# Patient Record
Sex: Male | Born: 1940 | Race: White | Hispanic: No | Marital: Single | Smoking: Former smoker
Health system: Southern US, Community
[De-identification: ages and names within clinical notes are randomized; demographics above are authoritative.]

## PROBLEM LIST (undated history)

## (undated) DIAGNOSIS — F1010 Alcohol abuse, uncomplicated: Secondary | ICD-10-CM

## (undated) DIAGNOSIS — C4491 Basal cell carcinoma of skin, unspecified: Secondary | ICD-10-CM

## (undated) DIAGNOSIS — N289 Disorder of kidney and ureter, unspecified: Secondary | ICD-10-CM

## (undated) DIAGNOSIS — L570 Actinic keratosis: Secondary | ICD-10-CM

## (undated) HISTORY — PX: OTHER SURGICAL HISTORY: SHX169

## (undated) HISTORY — DX: Actinic keratosis: L57.0

## (undated) HISTORY — DX: Basal cell carcinoma of skin, unspecified: C44.91

## (undated) HISTORY — PX: KNEE SURGERY: SHX244

---

## 2018-11-25 ENCOUNTER — Other Ambulatory Visit: Payer: Self-pay

## 2018-11-25 ENCOUNTER — Encounter: Payer: Self-pay | Admitting: Emergency Medicine

## 2018-11-25 ENCOUNTER — Emergency Department: Payer: Medicare Other

## 2018-11-25 ENCOUNTER — Emergency Department
Admission: EM | Admit: 2018-11-25 | Discharge: 2018-11-25 | Disposition: A | Discharge status: Home / Self Care | Payer: Medicare Other | Attending: Emergency Medicine | Admitting: Emergency Medicine

## 2018-11-25 DIAGNOSIS — Z79899 Other long term (current) drug therapy: Secondary | ICD-10-CM | POA: Diagnosis not present

## 2018-11-25 DIAGNOSIS — R42 Dizziness and giddiness: Secondary | ICD-10-CM | POA: Diagnosis present

## 2018-11-25 DIAGNOSIS — R112 Nausea with vomiting, unspecified: Secondary | ICD-10-CM | POA: Diagnosis not present

## 2018-11-25 DIAGNOSIS — I493 Ventricular premature depolarization: Secondary | ICD-10-CM | POA: Diagnosis not present

## 2018-11-25 DIAGNOSIS — E876 Hypokalemia: Secondary | ICD-10-CM | POA: Insufficient documentation

## 2018-11-25 HISTORY — DX: Alcohol abuse, uncomplicated: F10.10

## 2018-11-25 HISTORY — DX: Disorder of kidney and ureter, unspecified: N28.9

## 2018-11-25 LAB — CBC
HCT: 43.7 % (ref 39.0–52.0)
Hemoglobin: 15 g/dL (ref 13.0–17.0)
MCH: 30.7 pg (ref 26.0–34.0)
MCHC: 34.3 g/dL (ref 30.0–36.0)
MCV: 89.4 fL (ref 80.0–100.0)
Platelets: 197 10*3/uL (ref 150–400)
RBC: 4.89 MIL/uL (ref 4.22–5.81)
RDW: 11.5 % (ref 11.5–15.5)
WBC: 10.7 10*3/uL — ABNORMAL HIGH (ref 4.0–10.5)
nRBC: 0 % (ref 0.0–0.2)

## 2018-11-25 LAB — BASIC METABOLIC PANEL
Anion gap: 11 (ref 5–15)
BUN: 18 mg/dL (ref 8–23)
CO2: 22 mmol/L (ref 22–32)
Calcium: 8.8 mg/dL — ABNORMAL LOW (ref 8.9–10.3)
Chloride: 108 mmol/L (ref 98–111)
Creatinine, Ser: 1 mg/dL (ref 0.61–1.24)
GFR calc Af Amer: >60 mL/min (ref >60)
GFR calc non Af Amer: >60 mL/min (ref >60)
Glucose, Bld: 136 mg/dL — ABNORMAL HIGH (ref 70–99)
Potassium: 3.3 mmol/L — ABNORMAL LOW (ref 3.5–5.1)
Sodium: 141 mmol/L (ref 135–145)

## 2018-11-25 LAB — URINALYSIS, COMPLETE (UACMP) WITH MICROSCOPIC
Bacteria, UA: NONE SEEN
Bilirubin Urine: NEGATIVE
Glucose, UA: NEGATIVE mg/dL
Hgb urine dipstick: NEGATIVE
Ketones, ur: 5 mg/dL — AB
Leukocytes,Ua: NEGATIVE
Nitrite: NEGATIVE
Protein, ur: NEGATIVE mg/dL
Specific Gravity, Urine: 1.015 (ref 1.005–1.030)
pH: 6 (ref 5.0–8.0)

## 2018-11-25 LAB — TROPONIN I (HIGH SENSITIVITY)
Troponin I (High Sensitivity): 3 ng/L (ref <18)
Troponin I (High Sensitivity): 3 ng/L (ref <18)

## 2018-11-25 MED ORDER — SODIUM CHLORIDE 0.9% FLUSH: 3.0000 mL | Freq: Once | INTRAVENOUS | Status: DC

## 2018-11-25 MED ORDER — IOHEXOL 350 MG/ML SOLN
75.0000 mL | Freq: Once | INTRAVENOUS | Status: AC | PRN
  Administered 2018-11-25: 75 mL via INTRAVENOUS

## 2018-11-25 MED ORDER — POTASSIUM CHLORIDE CRYS ER 20 MEQ PO TBCR
40.0000 meq | EXTENDED_RELEASE_TABLET | Freq: Once | ORAL | Status: AC
  Administered 2018-11-25: 40 meq via ORAL
  Filled 2018-11-25: qty 2

## 2018-11-25 NOTE — ED Provider Notes (Signed)
[facility] Emergency Department Provider Note  ____________________________________________   First MD Initiated Contact with Patient 11/25/18 2031     (approximate)  I have reviewed the triage vital signs and the nursing notes.   HISTORY  Chief Complaint No chief complaint on file.    HPI Michael Johnson is a 78 y.o. male  Here with reported dizziness. Pt states that he was in his usual state of health until this afternoon. He reports that he was exercising, which is not uncommon for him, throughout the day. He had rested to eat and was going to go back to the room to exercise. He stood up, starting to walk and then experienced acute onset of what he describes as severe, room spinning sensation with associated nausea and vomiting. He felt like he was moving despite being still. He felt so dizzy that he could not stand and crawled to a phone to call for help. He states he did not have any associated vision changes, numbness, weakness, dysarthria, or dysphagia. He has no history of similar episodes. He called his daughter, who reports pt was feeling unwell but spoke well and normally, with no slurring. He now feels back to baseline.        Past Medical History:  Diagnosis Date   Alcohol abuse    Renal disorder    Stage 3 Kidney disease    There are no active problems to display for this patient.   Past Surgical History:  Procedure Laterality Date   Arm Surgery     KNEE SURGERY     Bilateral    Prior to Admission medications   Not on File    Allergies Patient has no known allergies.  History reviewed. No pertinent family history.  Social History Social History   Tobacco Use   Smoking status: Former Smoker    Types: Pipe   Smokeless tobacco: Never Used   Tobacco comment: Last use 36 yrs ago  Substance Use Topics   Alcohol use: Not Currently    Comment: Last use 36 yrs ago   Drug use: Not Currently    Review of Systems  Review  of Systems  Constitutional: Negative for chills, fatigue and fever.  HENT: Negative for sore throat.   Respiratory: Negative for shortness of breath.   Cardiovascular: Negative for chest pain.  Gastrointestinal: Positive for nausea and vomiting. Negative for abdominal pain.  Genitourinary: Negative for flank pain.  Musculoskeletal: Negative for neck pain.  Skin: Negative for rash and wound.  Allergic/Immunologic: Negative for immunocompromised state.  Neurological: Positive for dizziness. Negative for weakness and numbness.  Hematological: Does not bruise/bleed easily.  All other systems reviewed and are negative.    ____________________________________________  PHYSICAL EXAM:      VITAL SIGNS: ED Triage Vitals  Enc Vitals Group     BP 11/25/18 1444 (!) 147/71     Pulse Rate 11/25/18 1444 70     Resp 11/25/18 1444 (!) 22     Temp 11/25/18 1444 97.6 F (36.4 C)     Temp Source 11/25/18 1444 Oral     SpO2 11/25/18 1444 100 %     Weight 11/25/18 1442 180 lb (81.6 kg)     Height 11/25/18 1442 5\' 9"  (1.753 m)     Head Circumference --      Peak Flow --      Pain Score 11/25/18 1442 0     Pain Loc --      Pain Edu? --  Excl. in [place]? --      Physical Exam Vitals signs and nursing note reviewed.  Constitutional:      General: He is not in acute distress.    Appearance: He is well-developed.  HENT:     Head: Normocephalic and atraumatic.  Eyes:     Conjunctiva/sclera: Conjunctivae normal.  Neck:     Musculoskeletal: Neck supple.  Cardiovascular:     Rate and Rhythm: Normal rate and regular rhythm.     Heart sounds: Normal heart sounds.  Pulmonary:     Effort: Pulmonary effort is normal. No respiratory distress.     Breath sounds: No wheezing.  Abdominal:     General: There is no distension.  Skin:    General: Skin is warm.     Capillary Refill: Capillary refill takes less than 2 seconds.     Findings: No rash.  Neurological:     Mental Status: He is alert and  oriented to person, place, and time.     Motor: No abnormal muscle tone.     Comments: Neurological Exam:  Mental Status: Alert and oriented to person, place, and time. Attention and concentration normal. Speech clear. Recent memory is intact. Cranial Nerves: Visual fields grossly intact. EOMI and PERRLA. No nystagmus noted. Facial sensation intact at forehead, maxillary cheek, and chin/mandible bilaterally. No facial asymmetry or weakness. Hearing grossly normal. Uvula is midline, and palate elevates symmetrically. Normal SCM and trapezius strength. Tongue midline without fasciculations. Motor: Muscle strength 5/5 in proximal and distal UE and LE bilaterally. No pronator drift. Muscle tone normal. Sensation: Intact to light touch in upper and lower extremities distally bilaterally.  Gait: Normal without ataxia. Coordination: Normal FTN bilaterally.          ____________________________________________   LABS (all labs ordered are listed, but only abnormal results are displayed)  Labs Reviewed  BASIC METABOLIC PANEL - Abnormal; Notable for the following components:      Result Value   Potassium 3.3 (*)    Glucose, Bld 136 (*)    Calcium 8.8 (*)    All other components within normal limits  CBC - Abnormal; Notable for the following components:   WBC 10.7 (*)    All other components within normal limits  URINALYSIS, COMPLETE (UACMP) WITH MICROSCOPIC - Abnormal; Notable for the following components:   Color, Urine YELLOW (*)    APPearance CLEAR (*)    Ketones, ur 5 (*)    All other components within normal limits  TROPONIN I (HIGH SENSITIVITY)  TROPONIN I (HIGH SENSITIVITY)    ____________________________________________  EKG: Normal sinus rhythm, VR 67. QTc 458. No acute ST-t segment changes. No EKG evidence of acute ischemia or infarct. ________________________________________  RADIOLOGY All imaging, including plain films, CT scans, and ultrasounds, independently reviewed  by me, and interpretations confirmed via formal radiology reads.  ED MD interpretation:   CT Angio Head/Neck: Normal, no high grade stenosis, no CVA  Official radiology report(s): Ct Angio Head W Or Wo Contrast  Result Date: 11/25/2018 CLINICAL DATA:  Weakness and syncope. EXAM: CT ANGIOGRAPHY HEAD AND NECK TECHNIQUE: Multidetector CT imaging of the head and neck was performed using the standard protocol during bolus administration of intravenous contrast. Multiplanar CT image reconstructions and MIPs were obtained to evaluate the vascular anatomy. Carotid stenosis measurements (when applicable) are obtained utilizing NASCET criteria, using the distal internal carotid diameter as the denominator. CONTRAST:  19mL OMNIPAQUE IOHEXOL 350 MG/ML SOLN COMPARISON:  None. FINDINGS: CT HEAD FINDINGS Brain:  There is no mass, hemorrhage or extra-axial collection. Mild generalized volume loss. There is no acute or chronic infarction. The brain parenchyma is normal. Skull: The visualized skull base, calvarium and extracranial soft tissues are normal. Sinuses/Orbits: No fluid levels or advanced mucosal thickening of the visualized paranasal sinuses. No mastoid or middle ear effusion. The orbits are normal. CTA NECK FINDINGS SKELETON: There is no bony spinal canal stenosis. No lytic or blastic lesion. OTHER NECK: Normal pharynx, larynx and major salivary glands. No cervical lymphadenopathy. Unremarkable thyroid gland. UPPER CHEST: No pneumothorax or pleural effusion. No nodules or masses. AORTIC ARCH: There is no calcific atherosclerosis of the aortic arch. There is no aneurysm, dissection or hemodynamically significant stenosis of the visualized portion of the aorta. Conventional 3 vessel aortic branching pattern. The visualized proximal subclavian arteries are widely patent. RIGHT CAROTID SYSTEM: Normal without aneurysm, dissection or stenosis. LEFT CAROTID SYSTEM: Normal without aneurysm, dissection or stenosis.  VERTEBRAL ARTERIES: Left dominant configuration. Both origins are clearly patent. There is no dissection, occlusion or flow-limiting stenosis to the skull base (V1-V3 segments). CTA HEAD FINDINGS POSTERIOR CIRCULATION: --Vertebral arteries: Normal V4 segments. --Posterior inferior cerebellar arteries (PICA): Patent origins from the vertebral arteries. --Anterior inferior cerebellar arteries (AICA): Patent origins from the basilar artery. --Basilar artery: Normal. --Superior cerebellar arteries: Normal. --Posterior cerebral arteries: Normal. Both originate from the basilar artery. Posterior communicating arteries (p-comm) are diminutive or absent. ANTERIOR CIRCULATION: --Intracranial internal carotid arteries: Normal. --Anterior cerebral arteries (ACA): Normal. Both A1 segments are present. Patent anterior communicating artery (a-comm). --Middle cerebral arteries (MCA): Normal. VENOUS SINUSES: As permitted by contrast timing, patent. ANATOMIC VARIANTS: None Review of the MIP images confirms the above findings. IMPRESSION: 1. Normal aging brain. 2. Normal CTA of the head and neck. Electronically Signed   By: Ulyses Jarred M.D.   On: 11/25/2018 22:25   Ct Angio Neck W And/or Wo Contrast  Result Date: 11/25/2018 CLINICAL DATA:  Weakness and syncope. EXAM: CT ANGIOGRAPHY HEAD AND NECK TECHNIQUE: Multidetector CT imaging of the head and neck was performed using the standard protocol during bolus administration of intravenous contrast. Multiplanar CT image reconstructions and MIPs were obtained to evaluate the vascular anatomy. Carotid stenosis measurements (when applicable) are obtained utilizing NASCET criteria, using the distal internal carotid diameter as the denominator. CONTRAST:  74mL OMNIPAQUE IOHEXOL 350 MG/ML SOLN COMPARISON:  None. FINDINGS: CT HEAD FINDINGS Brain: There is no mass, hemorrhage or extra-axial collection. Mild generalized volume loss. There is no acute or chronic infarction. The brain  parenchyma is normal. Skull: The visualized skull base, calvarium and extracranial soft tissues are normal. Sinuses/Orbits: No fluid levels or advanced mucosal thickening of the visualized paranasal sinuses. No mastoid or middle ear effusion. The orbits are normal. CTA NECK FINDINGS SKELETON: There is no bony spinal canal stenosis. No lytic or blastic lesion. OTHER NECK: Normal pharynx, larynx and major salivary glands. No cervical lymphadenopathy. Unremarkable thyroid gland. UPPER CHEST: No pneumothorax or pleural effusion. No nodules or masses. AORTIC ARCH: There is no calcific atherosclerosis of the aortic arch. There is no aneurysm, dissection or hemodynamically significant stenosis of the visualized portion of the aorta. Conventional 3 vessel aortic branching pattern. The visualized proximal subclavian arteries are widely patent. RIGHT CAROTID SYSTEM: Normal without aneurysm, dissection or stenosis. LEFT CAROTID SYSTEM: Normal without aneurysm, dissection or stenosis. VERTEBRAL ARTERIES: Left dominant configuration. Both origins are clearly patent. There is no dissection, occlusion or flow-limiting stenosis to the skull base (V1-V3 segments). CTA HEAD FINDINGS POSTERIOR CIRCULATION: --  Vertebral arteries: Normal V4 segments. --Posterior inferior cerebellar arteries (PICA): Patent origins from the vertebral arteries. --Anterior inferior cerebellar arteries (AICA): Patent origins from the basilar artery. --Basilar artery: Normal. --Superior cerebellar arteries: Normal. --Posterior cerebral arteries: Normal. Both originate from the basilar artery. Posterior communicating arteries (p-comm) are diminutive or absent. ANTERIOR CIRCULATION: --Intracranial internal carotid arteries: Normal. --Anterior cerebral arteries (ACA): Normal. Both A1 segments are present. Patent anterior communicating artery (a-comm). --Middle cerebral arteries (MCA): Normal. VENOUS SINUSES: As permitted by contrast timing, patent. ANATOMIC  VARIANTS: None Review of the MIP images confirms the above findings. IMPRESSION: 1. Normal aging brain. 2. Normal CTA of the head and neck. Electronically Signed   By: Ulyses Jarred M.D.   On: 11/25/2018 22:25    ____________________________________________  PROCEDURES   Procedure(s) performed (including Critical Care):  Procedures  ____________________________________________  INITIAL IMPRESSION / MDM / [place] / ED COURSE  As part of my medical decision making, I reviewed the following data within the [place] notes reviewed and incorporated, Old chart reviewed, Notes from prior ED visits, and [place] Controlled Substance Database       *Michael Johnson was evaluated in Emergency Department on 11/26/2018 for the symptoms described in the history of present illness. He was evaluated in the context of the global COVID-19 pandemic, which necessitated consideration that the patient might be at risk for infection with the SARS-CoV-2 virus that causes COVID-19. Institutional protocols and algorithms that pertain to the evaluation of patients at risk for COVID-19 are in a state of rapid change based on information released by regulatory bodies including the CDC and federal and state organizations. These policies and algorithms were followed during the patient's care in the ED.  Some ED evaluations and interventions may be delayed as a result of limited staffing during the pandemic.*     Medical Decision Making:  78 yo M here with transient episode of likely vertigo. History fits with a peripheral etiology, though given his age must certainly consider central cause. On full neuro exam, he has no evidence to suggest ongoing cerebellar/posterior ischemia or mass. Symptoms are resolved. EKG is nonischemic with no arrhythmia. His labs are very reassuring overall. Mild hypokalemia is likely due to his intermittent fasting and poor PO intake today, and this was replaced.    Had a long discussion with pt, daughter regarding concern for possible posterior TIA, though differential also includes peripheral vertigo/BPPV, also possible transient hypoperfusion 2/2 arrhythmia (reported frequent PVCs per EMS, though minimal PVCs here on telemetry). Pt understands that despite a negative scan and labs, there remains a small but real chance of posterior stroke as well as risk of TIA with subsequent additional future CVA risk.  Based on shared decision making, and fact that sx are now resolved with reassuring labs and CT Angio, pt would like to continue work up as outpt. He declines admission at this time. Will refer him to neurology and his PCP. Will have him start a low-dose ASA while awaiting TIA eval and work-up. Return precautions given. ____________________________________________  FINAL CLINICAL IMPRESSION(S) / ED DIAGNOSES  Final diagnoses:  Dizziness  Vertigo  PVC's (premature ventricular contractions)  Hypokalemia     MEDICATIONS GIVEN DURING THIS VISIT:  Medications  iohexol (OMNIPAQUE) 350 MG/ML injection 75 mL (75 mLs Intravenous Contrast Given 11/25/18 2125)  potassium chloride SA (KLOR-CON) CR tablet 40 mEq (40 mEq Oral Given 11/25/18 2248)     ED Discharge Orders    None  Note:  This document was prepared using Dragon voice recognition software and may include unintentional dictation errors.   Duffy Bruce, MD 11/26/18 [phone]

## 2018-11-25 NOTE — ED Notes (Signed)
Patient updated that we are waiting on results from CT

## 2018-11-25 NOTE — Discharge Instructions (Addendum)
As we discussed, I would recommend taking an 81 mg aspirin daily.  You can take an over-the-counter version.  Take with food.  As we discussed as well, although we cannot definitively rule out a stroke as the cause of your vertigo, or a TIA, your imaging today was reassuring.  Your lab work was also very reassuring.  You had a mildly low potassium, which is likely diet related and I recommend that you increase fruit intake.  Please call your primary doctor on Monday as well as neurology at [facility], to arrange follow-up.

## 2018-11-25 NOTE — ED Notes (Signed)
Daughter updated on wait at this time.

## 2018-11-25 NOTE — ED Triage Notes (Signed)
Pt presents to ED via [facility] from home with c/o weakness and near syncope. Per EMS pt has 5 hr intermittent workout plan that he does at home, EMS reports sudden onset dizziness, nausea, and weakness. Pt reports does 18/6 intermittent fasting, states the fasting ends at 1400, has been doing that for 11 years. EMS reports pt crawled down the stairs to get to EMS. EMS reports trigeminy and bigeminy on their 12lead. EMS reports approx 300cc's NS and 4mg  Zofran given PTA.   Pt arrives alert and oriented on arrival. Pt noted to be pale on arrival to ED.   18g to R hand CBG 153 HR 72 100% on RA 144/70

## 2018-11-25 NOTE — ED Notes (Signed)
Pt's daughter to desk at this time regarding wait, pt's daughter states "He's been here since 68, what is he waiting for?" Pt's daughter noted to be agitated, and upset at this time. Pt's daughter did not acknowledge this RN, however spoke angrily and loudly to [place], Registration.

## 2018-11-25 NOTE — ED Notes (Addendum)
Patient is extremely frustrated and angry about the wait, daughter is with patient and is even more agitated. Patient angry about not being prioritized over other patients, says he doesn't understand why "prisoners" and people who are less serious are being treated before him; this RN tried multiple times to explain triage process to patient and the extended wait times people are experiencing due to the hospital being full. This RN Soil scientist and EDP

## 2018-11-25 NOTE — ED Notes (Signed)
ED Provider at bedside. 

## 2019-01-27 ENCOUNTER — Ambulatory Visit: Payer: Medicare Other | Attending: Internal Medicine

## 2019-01-27 DIAGNOSIS — Z23 Encounter for immunization: Secondary | ICD-10-CM | POA: Insufficient documentation

## 2019-01-27 NOTE — Progress Notes (Signed)
   Covid-19 Vaccination Clinic  Name:  Michael Johnson    MRN: [id number] DOB: Mar 29, 1940  01/27/2019  Michael Johnson was observed post Covid-19 immunization for 15 minutes without incidence. He was provided with Vaccine Information Sheet and instruction to access the V-Safe system.   Michael Johnson was instructed to call 911 with any severe reactions post vaccine: Marland Kitchen Difficulty breathing  . Swelling of your face and throat  . A fast heartbeat  . A bad rash all over your body  . Dizziness and weakness    Immunizations Administered    Name Date Dose VIS Date Route   Pfizer COVID-19 Vaccine 01/27/2019  9:39 AM 0.3 mL 12/23/2018 Intramuscular   Manufacturer: [employer]   Lot: [id number]   [place]: [id number]

## 2019-02-16 ENCOUNTER — Ambulatory Visit: Payer: Medicare Other | Attending: Internal Medicine

## 2019-02-16 DIAGNOSIS — Z23 Encounter for immunization: Secondary | ICD-10-CM

## 2019-02-16 NOTE — Progress Notes (Signed)
   Covid-19 Vaccination Clinic  Name:  Michael Johnson    MRN: [id number] DOB: 07/30/1940  02/16/2019  Michael Johnson was observed post Covid-19 immunization for 15 minutes without incidence. He was provided with Vaccine Information Sheet and instruction to access the V-Safe system.   Michael Johnson was instructed to call 911 with any severe reactions post vaccine: Marland Kitchen Difficulty breathing  . Swelling of your face and throat  . A fast heartbeat  . A bad rash all over your body  . Dizziness and weakness    Immunizations Administered    Name Date Dose VIS Date Route   Pfizer COVID-19 Vaccine 02/16/2019 12:44 PM 0.3 mL 12/23/2018 Intramuscular   Manufacturer: [place]   Lot: [id number]   [place]: [id number]

## 2019-06-22 ENCOUNTER — Other Ambulatory Visit: Payer: Self-pay

## 2019-06-22 ENCOUNTER — Ambulatory Visit (INDEPENDENT_AMBULATORY_CARE_PROVIDER_SITE_OTHER): Payer: Medicare Other | Admitting: Dermatology

## 2019-06-22 ENCOUNTER — Encounter: Payer: Self-pay | Admitting: Dermatology

## 2019-06-22 DIAGNOSIS — Z86018 Personal history of other benign neoplasm: Secondary | ICD-10-CM

## 2019-06-22 DIAGNOSIS — Z1283 Encounter for screening for malignant neoplasm of skin: Secondary | ICD-10-CM | POA: Diagnosis not present

## 2019-06-22 DIAGNOSIS — D229 Melanocytic nevi, unspecified: Secondary | ICD-10-CM | POA: Diagnosis not present

## 2019-06-22 DIAGNOSIS — Z85828 Personal history of other malignant neoplasm of skin: Secondary | ICD-10-CM | POA: Diagnosis not present

## 2019-06-22 DIAGNOSIS — D1801 Hemangioma of skin and subcutaneous tissue: Secondary | ICD-10-CM

## 2019-06-22 DIAGNOSIS — D692 Other nonthrombocytopenic purpura: Secondary | ICD-10-CM

## 2019-06-22 DIAGNOSIS — L814 Other melanin hyperpigmentation: Secondary | ICD-10-CM

## 2019-06-22 DIAGNOSIS — L578 Other skin changes due to chronic exposure to nonionizing radiation: Secondary | ICD-10-CM

## 2019-06-22 DIAGNOSIS — L821 Other seborrheic keratosis: Secondary | ICD-10-CM

## 2019-06-22 NOTE — Patient Instructions (Signed)
Recommend daily broad spectrum sunscreen SPF 30+ to sun-exposed areas, reapply every 2 hours as needed. Call for new or changing lesions.  

## 2019-06-22 NOTE — Progress Notes (Signed)
   Follow-Up Visit   Subjective  Michael Johnson is a 79 y.o. male who presents for the following: TBSE. The patient presents for Total-Body Skin Exam (TBSE) for skin cancer screening and mole check.  Patient presents today for a TBSE, he has no concerns today. Patient does have a history of [place] R. Chest and arms Patient prefers to be seen every 6 months for a TBSE  The following portions of the chart were reviewed this encounter and updated as appropriate:  Tobacco  Allergies  Meds  Problems  Med Hx  Surg Hx  Fam Hx      Review of Systems:  No other skin or systemic complaints except as noted in HPI or Assessment and Plan.  Objective  Well appearing patient in no apparent distress; mood and affect are within normal limits.  A full examination was performed including scalp, head, eyes, ears, nose, lips, neck, chest, axillae, abdomen, back, buttocks, bilateral upper extremities, bilateral lower extremities, hands, feet, fingers, toes, fingernails, and toenails. All findings within normal limits unless otherwise noted below.  Objective  Right Chest, arms: Well healed scar with no evidence of recurrence.   Objective  Right Superior Chest: Well healed scar    Assessment & Plan    History of basal cell carcinoma (BCC) Right Chest, arms  Clear. Observe for recurrence. Call clinic for new or changing lesions.  Recommend regular skin exams, daily broad-spectrum spf 30+ sunscreen use, and photoprotection.     Personal history of other benign neoplasm Right Superior Chest  BX proven: pigmented seborrheic overlying melanocytic nevus intradermal type. Clear. Observe for recurrence. Call clinic for new or changing lesions.  Recommend regular skin exams, daily broad-spectrum spf 30+ sunscreen use, and photoprotection.     Skin cancer screening  Lentigines - Scattered tan macules - Discussed due to sun exposure - Benign, observe - Call for any changes  Seborrheic Keratoses -  Stuck-on, waxy, tan-brown papules and plaques  - Discussed benign etiology and prognosis. - Observe - Call for any changes  Melanocytic Nevi - Tan-brown and/or pink-flesh-colored symmetric macules and papules - Benign appearing on exam today - Observation - Call clinic for new or changing moles - Recommend daily use of broad spectrum spf 30+ sunscreen to sun-exposed areas.   Hemangiomas - Red papules - Discussed benign nature - Observe - Call for any changes  Actinic Damage - diffuse scaly erythematous macules with underlying dyspigmentation - Recommend daily broad spectrum sunscreen SPF 30+ to sun-exposed areas, reapply every 2 hours as needed.  - Call for new or changing lesions.  Purpura - Violaceous macules and patches - Benign - Related to age, sun damage and/or use of blood thinners - Observe - Can use OTC arnica containing moisturizer such as Dermend Bruise Formula if desired - Call for worsening or other concerns   Skin cancer screening performed today.   Return in about 6 months (around 12/22/2019) for TBSE.  I, Donzetta Kohut, CMA, am acting as scribe for Sarina Ser, MD . Documentation: I have reviewed the above documentation for accuracy and completeness, and I agree with the above.  Sarina Ser, MD

## 2019-06-26 ENCOUNTER — Encounter: Payer: Self-pay | Admitting: Dermatology

## 2019-12-20 ENCOUNTER — Ambulatory Visit (INDEPENDENT_AMBULATORY_CARE_PROVIDER_SITE_OTHER): Payer: Medicare Other | Admitting: Dermatology

## 2019-12-20 ENCOUNTER — Other Ambulatory Visit: Payer: Self-pay

## 2019-12-20 DIAGNOSIS — L821 Other seborrheic keratosis: Secondary | ICD-10-CM

## 2019-12-20 DIAGNOSIS — D692 Other nonthrombocytopenic purpura: Secondary | ICD-10-CM

## 2019-12-20 DIAGNOSIS — D229 Melanocytic nevi, unspecified: Secondary | ICD-10-CM

## 2019-12-20 DIAGNOSIS — L814 Other melanin hyperpigmentation: Secondary | ICD-10-CM | POA: Diagnosis not present

## 2019-12-20 DIAGNOSIS — D1800 Hemangioma unspecified site: Secondary | ICD-10-CM

## 2019-12-20 DIAGNOSIS — Z1283 Encounter for screening for malignant neoplasm of skin: Secondary | ICD-10-CM | POA: Diagnosis not present

## 2019-12-20 DIAGNOSIS — L578 Other skin changes due to chronic exposure to nonionizing radiation: Secondary | ICD-10-CM

## 2019-12-20 DIAGNOSIS — Z85828 Personal history of other malignant neoplasm of skin: Secondary | ICD-10-CM

## 2019-12-20 DIAGNOSIS — L570 Actinic keratosis: Secondary | ICD-10-CM | POA: Diagnosis not present

## 2019-12-20 NOTE — Progress Notes (Signed)
   Follow-Up Visit   Subjective  Michael Johnson is a 79 y.o. male who presents for the following: mole check (Total body skin exam, hx of BCC). The patient presents for Total-Body Skin Exam (TBSE) for skin cancer screening and mole check.  The following portions of the chart were reviewed this encounter and updated as appropriate:   Tobacco  Allergies  Meds  Problems  Med Hx  Surg Hx  Fam Hx     Review of Systems:  No other skin or systemic complaints except as noted in HPI or Assessment and Plan.  Objective  Well appearing patient in no apparent distress; mood and affect are within normal limits.  A full examination was performed including scalp, head, eyes, ears, nose, lips, neck, chest, axillae, abdomen, back, buttocks, bilateral upper extremities, bilateral lower extremities, hands, feet, fingers, toes, fingernails, and toenails. All findings within normal limits unless otherwise noted below.  Objective  Scalp x 4, R ear x 2 (6): Pink scaly macules   Assessment & Plan    Lentigines - Scattered tan macules - Discussed due to sun exposure - Benign, observe - Call for any changes  Seborrheic Keratoses - Stuck-on, waxy, tan-brown papules and plaques  - Discussed benign etiology and prognosis. - Observe - Call for any changes  Melanocytic Nevi - Tan-brown and/or pink-flesh-colored symmetric macules and papules - Benign appearing on exam today - Observation - Call clinic for new or changing moles - Recommend daily use of broad spectrum spf 30+ sunscreen to sun-exposed areas.   Hemangiomas - Red papules - Discussed benign nature - Observe - Call for any changes  Actinic Damage - Chronic, secondary to cumulative UV/sun exposure - diffuse scaly erythematous macules with underlying dyspigmentation - Recommend daily broad spectrum sunscreen SPF 30+ to sun-exposed areas, reapply every 2 hours as needed.  - Call for new or changing lesions.  Skin cancer screening  performed today.  Purpura - Chronic; persistent and recurrent.  Treatable, but not curable. - Violaceous macules and patches - Benign - Related to age, sun damage and/or use of blood thinners - Observe - Can use OTC arnica containing moisturizer such as Dermend Bruise Formula if desired - Call for worsening or other concerns  History of Basal Cell Carcinoma of the Skin - No evidence of recurrence today - Recommend regular full body skin exams - Recommend daily broad spectrum sunscreen SPF 30+ to sun-exposed areas, reapply every 2 hours as needed.  - Call if any new or changing lesions are noted between office visits - arms/chest treated in past  AK (actinic keratosis) (6) Scalp x 4, R ear x 2  Destruction of lesion - Scalp x 4, R ear x 2 Complexity: simple   Destruction method: cryotherapy   Informed consent: discussed and consent obtained   Timeout:  patient name, date of birth, surgical site, and procedure verified Lesion destroyed using liquid nitrogen: Yes   Region frozen until ice ball extended beyond lesion: Yes   Outcome: patient tolerated procedure well with no complications   Post-procedure details: wound care instructions given    Skin cancer screening  Return in about 6 months (around 06/19/2020) for UBSE, Hx of BCC, Hx of AKs.   I, Othelia Pulling, RMA, am acting as scribe for Sarina Ser, MD .  Documentation: I have reviewed the above documentation for accuracy and completeness, and I agree with the above.  Sarina Ser, MD

## 2019-12-20 NOTE — Patient Instructions (Signed)
Cryotherapy Aftercare  . Wash gently with soap and water everyday.   . Apply Vaseline and Band-Aid daily until healed.  

## 2019-12-26 ENCOUNTER — Encounter: Payer: Self-pay | Admitting: Dermatology

## 2020-07-03 ENCOUNTER — Other Ambulatory Visit: Payer: Self-pay

## 2020-07-03 ENCOUNTER — Ambulatory Visit (INDEPENDENT_AMBULATORY_CARE_PROVIDER_SITE_OTHER): Payer: Medicare Other | Admitting: Dermatology

## 2020-07-03 DIAGNOSIS — D1800 Hemangioma unspecified site: Secondary | ICD-10-CM

## 2020-07-03 DIAGNOSIS — L570 Actinic keratosis: Secondary | ICD-10-CM | POA: Diagnosis not present

## 2020-07-03 DIAGNOSIS — Z1283 Encounter for screening for malignant neoplasm of skin: Secondary | ICD-10-CM | POA: Diagnosis not present

## 2020-07-03 DIAGNOSIS — Z85828 Personal history of other malignant neoplasm of skin: Secondary | ICD-10-CM | POA: Diagnosis not present

## 2020-07-03 DIAGNOSIS — L820 Inflamed seborrheic keratosis: Secondary | ICD-10-CM | POA: Diagnosis not present

## 2020-07-03 DIAGNOSIS — L821 Other seborrheic keratosis: Secondary | ICD-10-CM

## 2020-07-03 DIAGNOSIS — D692 Other nonthrombocytopenic purpura: Secondary | ICD-10-CM

## 2020-07-03 DIAGNOSIS — L578 Other skin changes due to chronic exposure to nonionizing radiation: Secondary | ICD-10-CM

## 2020-07-03 DIAGNOSIS — D229 Melanocytic nevi, unspecified: Secondary | ICD-10-CM

## 2020-07-03 DIAGNOSIS — L814 Other melanin hyperpigmentation: Secondary | ICD-10-CM

## 2020-07-03 NOTE — Progress Notes (Signed)
Follow-Up Visit   Subjective  Michael Johnson is a 80 y.o. male who presents for the following: Annual Exam (History of BCC - TBSE today). The patient presents for Total-Body Skin Exam (TBSE) for skin cancer screening and mole check.  The following portions of the chart were reviewed this encounter and updated as appropriate:   Tobacco  Allergies  Meds  Problems  Med Hx  Surg Hx  Fam Hx      Review of Systems:  No other skin or systemic complaints except as noted in HPI or Assessment and Plan.  Objective  Well appearing patient in no apparent distress; mood and affect are within normal limits.  A full examination was performed including scalp, head, eyes, ears, nose, lips, neck, chest, axillae, abdomen, back, buttocks, bilateral upper extremities, bilateral lower extremities, hands, feet, fingers, toes, fingernails, and toenails. All findings within normal limits unless otherwise noted below.  Trunk, arms (18) Erythematous keratotic or waxy stuck-on papule or plaque.   Right Temple Erythematous thin papules/macules with gritty scale.    Assessment & Plan   History of Basal Cell Carcinoma of the Skin - No evidence of recurrence today - Recommend regular full body skin exams - Recommend daily broad spectrum sunscreen SPF 30+ to sun-exposed areas, reapply every 2 hours as needed.  - Call if any new or changing lesions are noted between office visits  Purpura - Chronic; persistent and recurrent.  Treatable, but not curable. - Violaceous macules and patches - Benign - Related to trauma, age, sun damage and/or use of blood thinners, chronic use of topical and/or oral steroids - Observe - Can use OTC arnica containing moisturizer such as Dermend Bruise Formula if desired - Call for worsening or other concerns  Lentigines - Scattered tan macules - Due to sun exposure - Benign-appering, observe - Recommend daily broad spectrum sunscreen SPF 30+ to sun-exposed areas, reapply  every 2 hours as needed. - Call for any changes  Seborrheic Keratoses - Stuck-on, waxy, tan-brown papules and/or plaques  - Benign-appearing - Discussed benign etiology and prognosis. - Observe - Call for any changes  Melanocytic Nevi - Tan-brown and/or pink-flesh-colored symmetric macules and papules - Benign appearing on exam today - Observation - Call clinic for new or changing moles - Recommend daily use of broad spectrum spf 30+ sunscreen to sun-exposed areas.   Hemangiomas - Red papules - Discussed benign nature - Observe - Call for any changes  Actinic Damage - Chronic condition, secondary to cumulative UV/sun exposure - diffuse scaly erythematous macules with underlying dyspigmentation - Recommend daily broad spectrum sunscreen SPF 30+ to sun-exposed areas, reapply every 2 hours as needed.  - Staying in the shade or wearing long sleeves, sun glasses (UVA+UVB protection) and wide brim hats (4-inch brim around the entire circumference of the hat) are also recommended for sun protection.  - Call for new or changing lesions.  Skin cancer screening performed today.  Inflamed seborrheic keratosis Trunk, arms x18  Destruction of lesion - Trunk, arms Complexity: simple   Destruction method: cryotherapy   Informed consent: discussed and consent obtained   Timeout:  patient name, date of birth, surgical site, and procedure verified Lesion destroyed using liquid nitrogen: Yes   Region frozen until ice ball extended beyond lesion: Yes   Outcome: patient tolerated procedure well with no complications   Post-procedure details: wound care instructions given    AK (actinic keratosis) Right Temple x 1  Destruction of lesion - Right Temple Complexity: simple  Destruction method: cryotherapy   Informed consent: discussed and consent obtained   Timeout:  patient name, date of birth, surgical site, and procedure verified Lesion destroyed using liquid nitrogen: Yes   Region  frozen until ice ball extended beyond lesion: Yes   Outcome: patient tolerated procedure well with no complications   Post-procedure details: wound care instructions given    Skin cancer screening  Return in about 6 months (around 01/02/2021).  I, Ashok Cordia, CMA, am acting as scribe for Sarina Ser, MD .  Documentation: I have reviewed the above documentation for accuracy and completeness, and I agree with the above.  Sarina Ser, MD

## 2020-07-03 NOTE — Patient Instructions (Signed)

## 2020-07-09 ENCOUNTER — Encounter: Payer: Self-pay | Admitting: Dermatology

## 2020-07-24 ENCOUNTER — Other Ambulatory Visit: Payer: Self-pay

## 2020-07-24 ENCOUNTER — Ambulatory Visit (INDEPENDENT_AMBULATORY_CARE_PROVIDER_SITE_OTHER): Payer: Medicare Other | Admitting: Urology

## 2020-07-24 ENCOUNTER — Encounter: Payer: Self-pay | Admitting: Urology

## 2020-07-24 VITALS — BP 147/79 | HR 96 | Ht 69.0 in | Wt 192.0 lb

## 2020-07-24 DIAGNOSIS — N5082 Scrotal pain: Secondary | ICD-10-CM

## 2020-07-24 MED ORDER — SULFAMETHOXAZOLE-TRIMETHOPRIM 800-160 MG PO TABS: 1.0000 | ORAL_TABLET | Freq: Two times a day (BID) | ORAL | 0 refills | Status: AC

## 2020-07-24 NOTE — Progress Notes (Signed)
07/24/2020 3:13 PM   Michael Johnson 1940/04/15 [id number]  Referring provider: Derinda Late, MD [phone] [address] [place],  [place] [address]  Chief Complaint  Patient presents with   Other    HPI: Michael Johnson is a 80 y.o. male who presents for follow-up of recent PCP visit for epididymitis.  Saw Dr. Baldemar Lenis 07/16/2020 with left hemiscrotal pain Severity rated 7/10 History of chronic left hemiscrotal pain since 2014.  Saw a urologist in [place] and states the etiology of his pain was potentially felt to be neuropathic Prior history of scrotal trauma age 43 He has had a previous vasectomy On recent visit he was noted to have tenderness of the left epididymis and was treated with a 10-day course of Cipro which she has almost completed His pain is significantly improved   PMH: Past Medical History:  Diagnosis Date   Alcohol abuse    Basal cell carcinoma    Per patient's Medical History sheet at R chest, arms   Renal disorder    Stage 3 Kidney disease    Surgical History: Past Surgical History:  Procedure Laterality Date   Arm Surgery     KNEE SURGERY     Bilateral    Home Medications:  Allergies as of 07/24/2020   No Known Allergies      Medication List        Accurate as of July 24, 2020  3:13 PM. If you have any questions, ask your nurse or doctor.          albuterol 108 (90 Base) MCG/ACT inhaler Commonly known as: VENTOLIN HFA Inhale into the lungs.   gabapentin 300 MG capsule Commonly known as: NEURONTIN Take by mouth.   lisinopril 20 MG tablet Commonly known as: ZESTRIL Take 1 tablet by mouth daily.   metaxalone 800 MG tablet Commonly known as: SKELAXIN Take 800 mg by mouth 3 (three) times daily.   tiZANidine 4 MG tablet Commonly known as: ZANAFLEX Take 4 mg by mouth 3 (three) times daily.        Allergies: No Known Allergies  Family History: No family history on file.  Social  History:  reports that he has quit smoking. His smoking use included pipe. He has never used smokeless tobacco. He reports previous alcohol use. He reports previous drug use.   Physical Exam: BP (!) 147/79   Pulse 96   Ht 5\' 9"  (1.753 m)   Wt 192 lb (87.1 kg)   BMI 28.35 kg/m   Constitutional:  Alert and oriented, No acute distress. HEENT:  AT, moist mucus membranes.  Trachea midline, no masses. Cardiovascular: No clubbing, cyanosis, or edema. Respiratory: Normal respiratory effort, no increased work of breathing. GU: Phallus without lesions, testes descended bilaterally.  Testes soft without masses or tenderness; estimated volume 15 cc bilaterally.  Left epididymis palpably normal.  There is mild tenderness of his vas granuloma and slightly indurated vas deferens proximal to the granuloma Skin: No rashes, bruises or suspicious lesions. Neurologic: Grossly intact, no focal deficits, moving all 4 extremities. Psychiatric: Normal mood and affect.   Assessment & Plan:    1.  Left scrotal pain Unlikely infectious since he has had a previous vasectomy We discussed inflammatory epididymitis and inflammation of a vas granuloma Symptoms have significantly improved and he was reassured this is a benign condition He is completing a 10-day course of Cipro and will treat with an additional 10-day course of Septra DS.  Although infectious etiology less likely an extended antibiotic course could potentially prevent recurrence He will follow-up prn   Abbie Sons, MD  [facility] [address], [place], [place] [address] [phone]

## 2020-07-27 ENCOUNTER — Encounter: Payer: Self-pay | Admitting: Urology

## 2021-01-29 ENCOUNTER — Ambulatory Visit (INDEPENDENT_AMBULATORY_CARE_PROVIDER_SITE_OTHER): Payer: Medicare Other | Admitting: Dermatology

## 2021-01-29 ENCOUNTER — Other Ambulatory Visit: Payer: Self-pay

## 2021-01-29 ENCOUNTER — Encounter: Payer: Self-pay | Admitting: Dermatology

## 2021-01-29 DIAGNOSIS — D229 Melanocytic nevi, unspecified: Secondary | ICD-10-CM

## 2021-01-29 DIAGNOSIS — D1800 Hemangioma unspecified site: Secondary | ICD-10-CM

## 2021-01-29 DIAGNOSIS — L820 Inflamed seborrheic keratosis: Secondary | ICD-10-CM

## 2021-01-29 DIAGNOSIS — L578 Other skin changes due to chronic exposure to nonionizing radiation: Secondary | ICD-10-CM

## 2021-01-29 DIAGNOSIS — L853 Xerosis cutis: Secondary | ICD-10-CM

## 2021-01-29 DIAGNOSIS — L821 Other seborrheic keratosis: Secondary | ICD-10-CM

## 2021-01-29 DIAGNOSIS — Z1283 Encounter for screening for malignant neoplasm of skin: Secondary | ICD-10-CM

## 2021-01-29 DIAGNOSIS — Z85828 Personal history of other malignant neoplasm of skin: Secondary | ICD-10-CM | POA: Diagnosis not present

## 2021-01-29 DIAGNOSIS — L814 Other melanin hyperpigmentation: Secondary | ICD-10-CM

## 2021-01-29 NOTE — Patient Instructions (Addendum)
If You Need Anything After Your Visit ° °If you have any questions or concerns for your doctor, please call our main line at [phone] and press option 4 to reach your doctor's medical assistant. If no one answers, please leave a voicemail as directed and we will return your call as soon as possible. Messages left after 4 pm will be answered the following business day.  ° °You may also send us a message via MyChart. We typically respond to MyChart messages within 1-2 business days. ° °For prescription refills, please ask your pharmacy to contact our office. Our fax number is [phone]. ° °If you have an urgent issue when the clinic is closed that cannot wait until the next business day, you can page your doctor at the number below.   ° °Please note that while we do our best to be available for urgent issues outside of office hours, we are not available 24/7.  ° °If you have an urgent issue and are unable to reach us, you may choose to seek medical care at your doctor's office, retail clinic, urgent care center, or emergency room. ° °If you have a medical emergency, please immediately call 911 or go to the emergency department. ° °Pager Numbers ° °- Dr. Kowalski: [phone] ° °- Dr. Moye: [phone] ° °- Dr. Stewart: [phone] ° °In the event of inclement weather, please call our main line at [phone] for an update on the status of any delays or closures. ° °Dermatology Medication Tips: °Please keep the boxes that topical medications come in in order to help keep track of the instructions about where and how to use these. Pharmacies typically print the medication instructions only on the boxes and not directly on the medication tubes.  ° °If your medication is too expensive, please contact our office at [phone] option 4 or send us a message through MyChart.  ° °We are unable to tell what your co-pay for medications will be in advance as this is different depending on your insurance coverage.  However, we may be able to find a substitute medication at lower cost or fill out paperwork to get insurance to cover a needed medication.  ° °If a prior authorization is required to get your medication covered by your insurance company, please allow us 1-2 business days to complete this process. ° °Drug prices often vary depending on where the prescription is filled and some pharmacies may offer cheaper prices. ° °The website www.goodrx.com contains coupons for medications through different pharmacies. The prices here do not account for what the cost may be with help from insurance (it may be cheaper with your insurance), but the website can give you the price if you did not use any insurance.  °- You can print the associated coupon and take it with your prescription to the pharmacy.  °- You may also stop by our office during regular business hours and pick up a GoodRx coupon card.  °- If you need your prescription sent electronically to a different pharmacy, notify our office through [facility] MyChart or by phone at [phone] option 4. ° ° ° ° °Si Usted Necesita Algo Después de Su Visita ° °También puede enviarnos un mensaje a través de MyChart. Por lo general respondemos a los mensajes de MyChart en el transcurso de 1 a 2 días hábiles. ° °Para renovar recetas, por favor pida a su farmacia que se ponga en contacto con nuestra oficina. Nuestro número de fax es el [phone]. ° °Si tiene   un asunto urgente cuando la clnica est cerrada y que no puede esperar hasta el siguiente da hbil, puede llamar/localizar a su doctor(a) al nmero que aparece a continuacin.   Por favor, tenga en cuenta que aunque hacemos todo lo posible para estar disponibles para asuntos urgentes fuera del horario de [place], no estamos disponibles las 24 horas del da, los 7 das de la [place].   Si tiene un problema urgente y no puede comunicarse con nosotros, puede optar por buscar atencin mdica  en el consultorio de su  doctor(a), en una clnica privada, en un centro de atencin urgente o en una sala de emergencias.  Si tiene Engineering geologist, por favor llame inmediatamente al 911 o vaya a la sala de emergencias.  Nmeros de bper  - Dr. Nehemiah Massed: [phone]  - Dra. Moye: [phone]  - Dra. Nicole Kindred: [phone]  En caso de inclemencias del [place], por favor llame a Johnsie Kindred principal al [phone] para una actualizacin sobre el [place] de cualquier retraso o cierre.  Consejos para la medicacin en dermatologa: Por favor, guarde las cajas en las que vienen los medicamentos de uso tpico para ayudarle a seguir las instrucciones sobre dnde y cmo usarlos. Las farmacias generalmente imprimen las instrucciones del medicamento slo en las cajas y no directamente en los tubos del [place].   Si su medicamento es muy caro, por favor, pngase en contacto con Zigmund Daniel llamando al [phone] y presione la opcin 4 o envenos un mensaje a travs de Pharmacist, community.   No podemos decirle cul ser su copago por los medicamentos por adelantado ya que esto es diferente dependiendo de la cobertura de su seguro. Sin embargo, es posible que podamos encontrar un medicamento sustituto a Electrical engineer un formulario para que el seguro cubra el medicamento que se considera necesario.   Si se requiere una autorizacin previa para que su compaa de seguros [place] su medicamento, por favor permtanos de 1 a 2 das hbiles para completar este proceso.  Los precios de los medicamentos varan con frecuencia dependiendo del Environmental consultant de dnde se surte la receta y alguna farmacias pueden ofrecer precios ms baratos.  El sitio web www.goodrx.com tiene cupones para medicamentos de Airline pilot. Los precios aqu no tienen en cuenta lo que podra costar con la ayuda del seguro (puede ser ms barato con su seguro), pero el sitio web puede darle el precio si no utiliz Research scientist (physical sciences).  - Puede imprimir el cupn  correspondiente y llevarlo con su receta a la farmacia.  - Tambin puede pasar por nuestra oficina durante el horario de atencin regular y Charity fundraiser una tarjeta de cupones de GoodRx.  - Si necesita que su receta se enve electrnicamente a una farmacia diferente, informe a nuestra oficina a travs de MyChart de [place] o por telfono llamando al [phone] y presione la opcin 4.    Gentle Skin Care Guide  1. Bathe no more than once a day.  2. Avoid bathing in hot water  3. Use a mild soap like Dove, Vanicream, Cetaphil, CeraVe. Can use Lever 2000 or Cetaphil antibacterial soap  4. Use soap only where you need it. On most days, use it under your arms, between your legs, and on your feet. Let the water rinse other areas unless visibly dirty.  5. When you get out of the bath/shower, use a towel to gently blot your skin dry, don't rub it.  6. While your skin is still a little damp, apply a moisturizing  cream such as Vanicream, CeraVe, Cetaphil, Eucerin, Sarna lotion or plain Vaseline Jelly. For hands apply Neutrogena [place] Hand Cream or Excipial Hand Cream.  7. Reapply moisturizer any time you start to itch or feel dry.  8. Sometimes using free and clear laundry detergents can be helpful. Fabric softener sheets should be avoided. Downy Free & Gentle liquid, or any liquid fabric softener that is free of dyes and perfumes, it acceptable to use  9. If your doctor has given you prescription creams you may apply moisturizers over them

## 2021-01-29 NOTE — Progress Notes (Signed)
Follow-Up Visit   Subjective  Handsome Anglin is a 81 y.o. male who presents for the following: Annual Exam (Hx of BCC, ISK's, and AK's - patient is here for skin cancer screening.). The patient presents for Total-Body Skin Exam (TBSE) for skin cancer screening and mole check.  The patient has spots, moles and lesions to be evaluated, some may be new or changing.  The following portions of the chart were reviewed this encounter and updated as appropriate:   Tobacco   Allergies   Meds   Problems   Med Hx   Surg Hx   Fam Hx      Review of Systems:  No other skin or systemic complaints except as noted in HPI or Assessment and Plan.  Objective  Well appearing patient in no apparent distress; mood and affect are within normal limits.  A full examination was performed including scalp, head, eyes, ears, nose, lips, neck, chest, axillae, abdomen, back, buttocks, bilateral upper extremities, bilateral lower extremities, hands, feet, fingers, toes, fingernails, and toenails. All findings within normal limits unless otherwise noted below.  R chest x 1, R forehead x 1 (2) Erythematous stuck-on, waxy papule or plaque   Assessment & Plan  Inflamed seborrheic keratosis (2) R chest x 1, R forehead x 1  Destruction of lesion - R chest x 1, R forehead x 1 Complexity: simple   Destruction method: cryotherapy   Informed consent: discussed and consent obtained   Timeout:  patient name, date of birth, surgical site, and procedure verified Lesion destroyed using liquid nitrogen: Yes   Region frozen until ice ball extended beyond lesion: Yes   Outcome: patient tolerated procedure well with no complications   Post-procedure details: wound care instructions given    Lentigines - Scattered tan macules - Due to sun exposure - Benign-appearing, observe - Recommend daily broad spectrum sunscreen SPF 30+ to sun-exposed areas, reapply every 2 hours as needed. - Call for any changes  Seborrheic Keratoses -  Stuck-on, waxy, tan-brown papules and/or plaques  - Benign-appearing - Discussed benign etiology and prognosis. - Observe - Call for any changes  Melanocytic Nevi - Tan-brown and/or pink-flesh-colored symmetric macules and papules - Benign appearing on exam today - Observation - Call clinic for new or changing moles - Recommend daily use of broad spectrum spf 30+ sunscreen to sun-exposed areas.   Hemangiomas - Red papules - Discussed benign nature - Observe - Call for any changes  Actinic Damage - Chronic condition, secondary to cumulative UV/sun exposure - diffuse scaly erythematous macules with underlying dyspigmentation - Recommend daily broad spectrum sunscreen SPF 30+ to sun-exposed areas, reapply every 2 hours as needed.  - Staying in the shade or wearing long sleeves, sun glasses (UVA+UVB protection) and wide brim hats (4-inch brim around the entire circumference of the hat) are also recommended for sun protection.  - Call for new or changing lesions.  History of Basal Cell Carcinoma of the Skin - No evidence of recurrence today - Recommend regular full body skin exams - Recommend daily broad spectrum sunscreen SPF 30+ to sun-exposed areas, reapply every 2 hours as needed.  - Call if any new or changing lesions are noted between office visits  Xerosis - diffuse xerotic patches - recommend gentle, hydrating skin care - gentle skin care handout given  Skin cancer screening performed today.  Return in about 1 year (around 01/29/2022) for TBSE - Hx BCC, AK's, ISK's .  Luther Redo, CMA, am acting as scribe  for Sarina Ser, MD . Documentation: I have reviewed the above documentation for accuracy and completeness, and I agree with the above.  Sarina Ser, MD

## 2021-05-01 IMAGING — CT CT ANGIO HEAD
1 of 10 series · 6 of 33 positions shown · IV contrast (APPLIED)
Comparison: None.

CLINICAL DATA: Weakness and syncope.

EXAM:
CT ANGIOGRAPHY HEAD AND NECK
TECHNIQUE: Multidetector CT imaging of the head and neck was performed using
the standard protocol during bolus administration of intravenous
contrast. Multiplanar CT image reconstructions and MIPs were
obtained to evaluate the vascular anatomy. Carotid stenosis
measurements (when applicable) are obtained utilizing NASCET
criteria, using the distal internal carotid diameter as the
denominator.
CONTRAST:  75mL OMNIPAQUE IOHEXOL 350 MG/ML SOLN

[Series 10: ax thin · axial · 0.53mm/px · z∈[-312,-47]mm · 6 of 373 slices shown]
[im 54/373  soft-tissue]
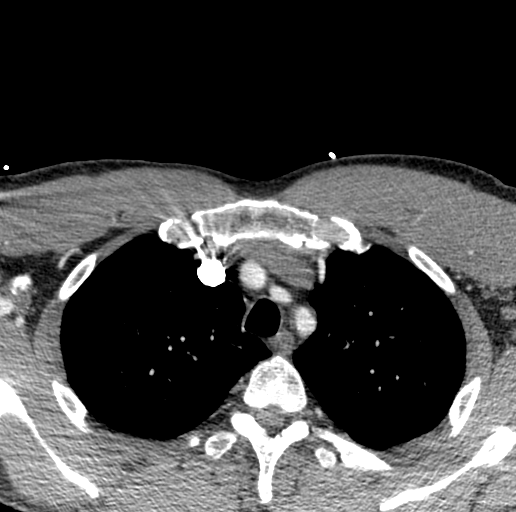
[im 107/373  bone]
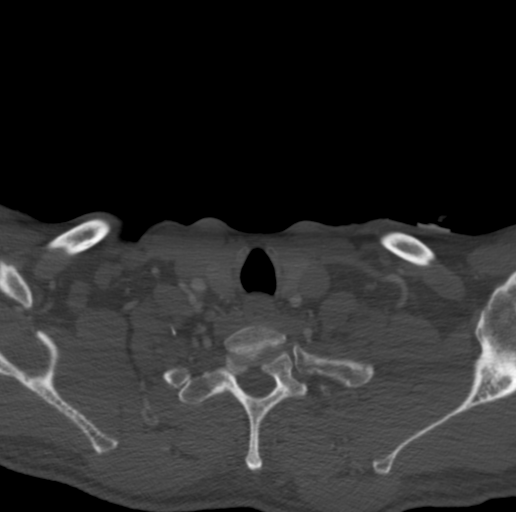
[im 160/373  soft-tissue]
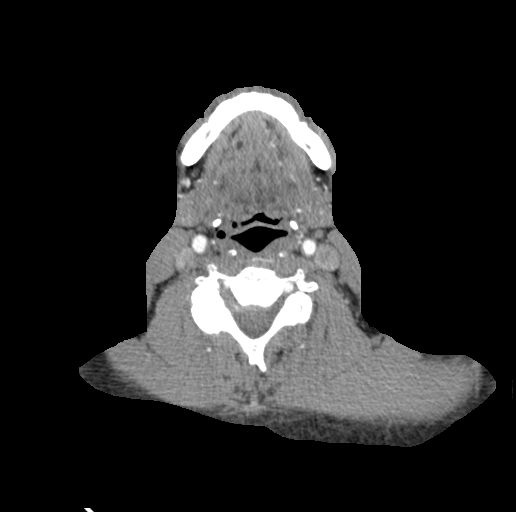
[im 213/373  bone]
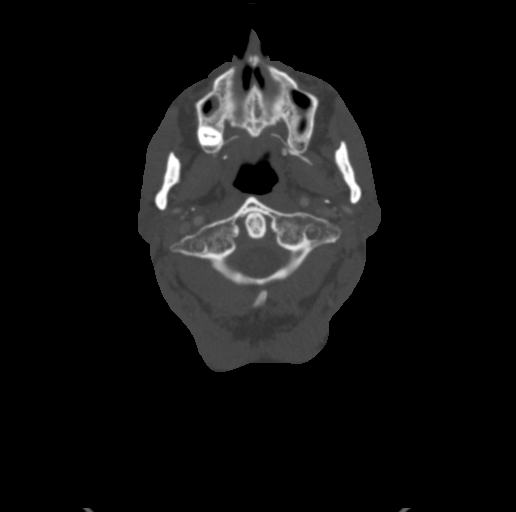
[im 266/373  soft-tissue]
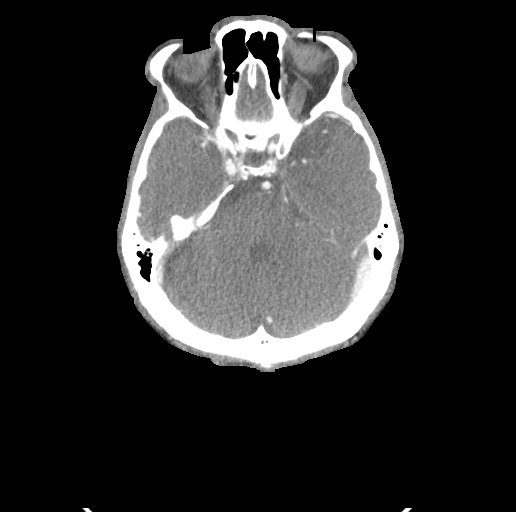
[im 319/373  bone]
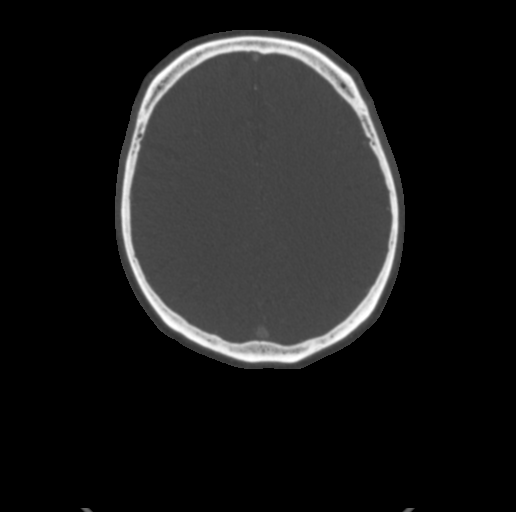

[6 of 33 positions shown; findings below may reference images not displayed]

FINDINGS: CT HEAD FINDINGS

Brain: There is no mass, hemorrhage or extra-axial collection. Mild
generalized volume loss. There is no acute or chronic infarction.
The brain parenchyma is normal.

Skull: The visualized skull base, calvarium and extracranial soft
tissues are normal.

Sinuses/Orbits: No fluid levels or advanced mucosal thickening of
the visualized paranasal sinuses. No mastoid or middle ear effusion.
The orbits are normal.

CTA NECK FINDINGS

SKELETON: There is no bony spinal canal stenosis. No lytic or
blastic lesion.

OTHER NECK: Normal pharynx, larynx and major salivary glands. No
cervical lymphadenopathy. Unremarkable thyroid gland.

UPPER CHEST: No pneumothorax or pleural effusion. No nodules or
masses.

AORTIC ARCH:

There is no calcific atherosclerosis of the aortic arch. There is no
aneurysm, dissection or hemodynamically significant stenosis of the
visualized portion of the aorta. Conventional 3 vessel aortic
branching pattern. The visualized proximal subclavian arteries are
widely patent.

RIGHT CAROTID SYSTEM: Normal without aneurysm, dissection or
stenosis.

LEFT CAROTID SYSTEM: Normal without aneurysm, dissection or
stenosis.

VERTEBRAL ARTERIES: Left dominant configuration. Both origins are
clearly patent. There is no dissection, occlusion or flow-limiting
stenosis to the skull base (V1-V3 segments).

CTA HEAD FINDINGS

POSTERIOR CIRCULATION:

--Vertebral arteries: Normal V4 segments.

--Posterior inferior cerebellar arteries (PICA): Patent origins from
the vertebral arteries.

--Anterior inferior cerebellar arteries (AICA): Patent origins from
the basilar artery.

--Basilar artery: Normal.

--Superior cerebellar arteries: Normal.

--Posterior cerebral arteries: Normal. Both originate from the
basilar artery. Posterior communicating arteries (p-comm) are
diminutive or absent.

ANTERIOR CIRCULATION:

--Intracranial internal carotid arteries: Normal.

--Anterior cerebral arteries (ACA): Normal. Both A1 segments are
present. Patent anterior communicating artery (a-comm).

--Middle cerebral arteries (MCA): Normal.

VENOUS SINUSES: As permitted by contrast timing, patent.

ANATOMIC VARIANTS: None

Review of the MIP images confirms the above findings.
IMPRESSION: 1. Normal aging brain.
2. Normal CTA of the head and neck.

## 2022-01-29 ENCOUNTER — Ambulatory Visit: Payer: Medicare Other | Admitting: Dermatology

## 2022-02-05 ENCOUNTER — Ambulatory Visit (INDEPENDENT_AMBULATORY_CARE_PROVIDER_SITE_OTHER): Payer: Medicare Other | Admitting: Dermatology

## 2022-02-05 VITALS — BP 124/73 | HR 77

## 2022-02-05 DIAGNOSIS — Z79899 Other long term (current) drug therapy: Secondary | ICD-10-CM | POA: Diagnosis not present

## 2022-02-05 DIAGNOSIS — L578 Other skin changes due to chronic exposure to nonionizing radiation: Secondary | ICD-10-CM

## 2022-02-05 DIAGNOSIS — Z1283 Encounter for screening for malignant neoplasm of skin: Secondary | ICD-10-CM

## 2022-02-05 DIAGNOSIS — L570 Actinic keratosis: Secondary | ICD-10-CM | POA: Diagnosis not present

## 2022-02-05 DIAGNOSIS — L820 Inflamed seborrheic keratosis: Secondary | ICD-10-CM | POA: Diagnosis not present

## 2022-02-05 DIAGNOSIS — Z85828 Personal history of other malignant neoplasm of skin: Secondary | ICD-10-CM | POA: Diagnosis not present

## 2022-02-05 DIAGNOSIS — L814 Other melanin hyperpigmentation: Secondary | ICD-10-CM

## 2022-02-05 DIAGNOSIS — R202 Paresthesia of skin: Secondary | ICD-10-CM | POA: Diagnosis not present

## 2022-02-05 DIAGNOSIS — D229 Melanocytic nevi, unspecified: Secondary | ICD-10-CM

## 2022-02-05 DIAGNOSIS — L821 Other seborrheic keratosis: Secondary | ICD-10-CM

## 2022-02-05 NOTE — Progress Notes (Signed)
Follow-Up Visit   Subjective  Michael Johnson is a 82 y.o. male who presents for the following: Total body skin exam (Hx of BCC, hx of AKs). The patient presents for Total-Body Skin Exam (TBSE) for skin cancer screening and mole check.  The patient has spots, moles and lesions to be evaluated, some may be new or changing and the patient has concerns that these could be cancer.   The following portions of the chart were reviewed this encounter and updated as appropriate:   Tobacco  Allergies  Meds  Problems  Med Hx  Surg Hx  Fam Hx     Review of Systems:  No other skin or systemic complaints except as noted in HPI or Assessment and Plan.  Objective  Well appearing patient in no apparent distress; mood and affect are within normal limits.  A full examination was performed including scalp, head, eyes, ears, nose, lips, neck, chest, axillae, abdomen, back, buttocks, bilateral upper extremities, bilateral lower extremities, hands, feet, fingers, toes, fingernails, and toenails. All findings within normal limits unless otherwise noted below.  face, scalp x 6 (6) Pink scaly macules  R wrist x 1 Stuck on waxy paps with erythema  Right Lower Back Back clear   Assessment & Plan   Lentigines - Scattered tan macules - Due to sun exposure - Benign-appearing, observe - Recommend daily broad spectrum sunscreen SPF 30+ to sun-exposed areas, reapply every 2 hours as needed. - Call for any changes  Seborrheic Keratoses - Stuck-on, waxy, tan-brown papules and/or plaques  - Benign-appearing - Discussed benign etiology and prognosis. - Observe - Call for any changes  Melanocytic Nevi - Tan-brown and/or pink-flesh-colored symmetric macules and papules - Benign appearing on exam today - Observation - Call clinic for new or changing moles - Recommend daily use of broad spectrum spf 30+ sunscreen to sun-exposed areas.   Hemangiomas - Red papules - Discussed benign nature - Observe -  Call for any changes  Actinic Damage - Chronic condition, secondary to cumulative UV/sun exposure - diffuse scaly erythematous macules with underlying dyspigmentation - Recommend daily broad spectrum sunscreen SPF 30+ to sun-exposed areas, reapply every 2 hours as needed.  - Staying in the shade or wearing long sleeves, sun glasses (UVA+UVB protection) and wide brim hats (4-inch brim around the entire circumference of the hat) are also recommended for sun protection.  - Call for new or changing lesions.  Skin cancer screening performed today.   History of Basal Cell Carcinoma of the Skin - No evidence of recurrence today - Recommend regular full body skin exams - Recommend daily broad spectrum sunscreen SPF 30+ to sun-exposed areas, reapply every 2 hours as needed.  - Call if any new or changing lesions are noted between office visits  - R chest, arms  AK (actinic keratosis) (6) face, scalp x 6  Destruction of lesion - face, scalp x 6 Complexity: simple   Destruction method: cryotherapy   Informed consent: discussed and consent obtained   Timeout:  patient name, date of birth, surgical site, and procedure verified Lesion destroyed using liquid nitrogen: Yes   Region frozen until ice ball extended beyond lesion: Yes   Outcome: patient tolerated procedure well with no complications   Post-procedure details: wound care instructions given    Inflamed seborrheic keratosis R wrist x 1  Symptomatic, irritating, patient would like treated.   Destruction of lesion - R wrist x 1 Complexity: simple   Destruction method: cryotherapy   Informed consent:  discussed and consent obtained   Timeout:  patient name, date of birth, surgical site, and procedure verified Lesion destroyed using liquid nitrogen: Yes   Region frozen until ice ball extended beyond lesion: Yes   Outcome: patient tolerated procedure well with no complications   Post-procedure details: wound care instructions given     Notalgia paresthetica Right Lower Back  Notalgia paresthetica is a chronic condition affecting the skin of the back in which a pinched nerve along the spine causes itching or changes in sensation in an area of skin. This is usually accompanied by chronic rubbing or scratching often leaving the area of skin discolored and thickened. There is no cure, but there are some treatments which may help control the itch.   Over the counter (non-prescription) treatments for notalgia paresthetica include numbing creams like pramoxine or lidocaine which temporarily reduce itch or Capsaicin-containing creams which cause a burning sensation but which sometimes over time will reset the nerves to stop producing itch.  If you choose to use Capsaicin cream, it is recommended to use it 5 times daily for 1 week followed by 3 times daily for 3-6 weeks. You may have to continue using it long-term. For severe cases, there are some prescription cream or pill options which may help. Other treatment options include: - Transcutaneous Electrical Nerve Stimulation (TENS) - Gabapentin 300-900 mg daily po - Amitriptyline orally - Paravertebral local anesthetic block - intralesional Botulinum toxin A   Return in about 1 year (around 02/06/2023) for TBSE, Hx of BCC, Hx of AKs.  I, Othelia Pulling, RMA, am acting as scribe for Sarina Ser, MD . Documentation: I have reviewed the above documentation for accuracy and completeness, and I agree with the above.  Sarina Ser, MD

## 2022-02-05 NOTE — Patient Instructions (Addendum)
Cryotherapy Aftercare  Wash gently with soap and water everyday.   Apply Vaseline and Band-Aid daily until healed.    Itching on back Notalgia paresthetica is a chronic condition affecting the skin of the back in which a pinched nerve along the spine causes itching or changes in sensation in an area of skin. This is usually accompanied by chronic rubbing or scratching often leaving the area of skin discolored and thickened. There is no cure, but there are some treatments which may help control the itch.   Over the counter (non-prescription) treatments for notalgia paresthetica include numbing creams like pramoxine or lidocaine which temporarily reduce itch or Capsaicin-containing creams which cause a burning sensation but which sometimes over time will reset the nerves to stop producing itch.  If you choose to use Capsaicin cream, it is recommended to use it 5 times daily for 1 week followed by 3 times daily for 3-6 weeks. You may have to continue using it long-term. For severe cases, there are some prescription cream or pill options which may help. Other treatment options include: - Transcutaneous Electrical Nerve Stimulation (TENS) - Gabapentin 300-900 mg daily po - Amitriptyline orally - Paravertebral local anesthetic block - intralesional Botulinum toxin A    Due to recent changes in healthcare laws, you may see results of your pathology and/or laboratory studies on MyChart before the doctors have had a chance to review them. We understand that in some cases there may be results that are confusing or concerning to you. Please understand that not all results are received at the same time and often the doctors may need to interpret multiple results in order to provide you with the best plan of care or course of treatment. Therefore, we ask that you please give [place] 2 business days to thoroughly review all your results before contacting the office for clarification. Should we see a critical lab  result, you will be contacted sooner.   If You Need Anything After Your Visit  If you have any questions or concerns for your doctor, please call our main line at [phone] and press option 4 to reach your doctor's medical assistant. If no one answers, please leave a voicemail as directed and we will return your call as soon as possible. Messages left after 4 pm will be answered the following business day.   You may also send [place] a message via [place]. We typically respond to MyChart messages within 1-2 business days.  For prescription refills, please ask your pharmacy to contact our office. Our fax number is [phone].  If you have an urgent issue when the clinic is closed that cannot wait until the next business day, you can page your doctor at the number below.    Please note that while we do our best to be available for urgent issues outside of office hours, we are not available 24/7.   If you have an urgent issue and are unable to reach [place], you may choose to seek medical care at your doctor's office, retail clinic, urgent care center, or emergency room.  If you have a medical emergency, please immediately call 911 or go to the emergency department.  Pager Numbers  - Dr. Nehemiah Massed: [phone]  - Dr. Laurence Ferrari: [phone]  - Dr. Nicole Kindred: [phone]  In the event of inclement weather, please call our main line at [phone] for an update on the status of any delays or closures.  Dermatology Medication Tips: Please keep the boxes that topical medications come in  in order to help keep track of the instructions about where and how to use these. Pharmacies typically print the medication instructions only on the boxes and not directly on the medication tubes.   If your medication is too expensive, please contact our office at [phone] option 4 or send [place] a message through [place].   We are unable to tell what your co-pay for medications will be in advance as this is  different depending on your insurance coverage. However, we may be able to find a substitute medication at lower cost or fill out paperwork to get insurance to cover a needed medication.   If a prior authorization is required to get your medication covered by your insurance company, please allow [place] 1-2 business days to complete this process.  Drug prices often vary depending on where the prescription is filled and some pharmacies may offer cheaper prices.  The website www.goodrx.com contains coupons for medications through different pharmacies. The prices here do not account for what the cost may be with help from insurance (it may be cheaper with your insurance), but the website can give you the price if you did not use any insurance.  - You can print the associated coupon and take it with your prescription to the pharmacy.  - You may also stop by our office during regular business hours and pick up a GoodRx coupon card.  - If you need your prescription sent electronically to a different pharmacy, notify our office through [facility] or by phone at [phone] option 4.     Si Usted Necesita Algo Despus de Su Visita  Tambin puede enviarnos un mensaje a travs de Pharmacist, community. Por lo general respondemos a los mensajes de MyChart en el transcurso de 1 a 2 das hbiles.  Para renovar recetas, por favor pida a su farmacia que se ponga en contacto con nuestra oficina. Harland Dingwall de fax es [place] [phone].  Si tiene un asunto urgente cuando la clnica est cerrada y que no puede esperar hasta el siguiente da hbil, puede llamar/localizar a su doctor(a) al nmero que aparece a continuacin.   Por favor, tenga en cuenta que aunque hacemos todo lo posible para estar disponibles para asuntos urgentes fuera del horario de [place], no estamos disponibles las 24 horas del da, los 7 das de la [place].   Si tiene un problema urgente y no puede comunicarse con nosotros, puede optar por buscar  atencin mdica  en el consultorio de su doctor(a), en una clnica privada, en un centro de atencin urgente o en una sala de emergencias.  Si tiene Engineering geologist, por favor llame inmediatamente al 911 o vaya a la sala de emergencias.  Nmeros de bper  - Dr. Nehemiah Massed: [phone]  - Dra. Moye: [phone]  - Dra. Nicole Kindred: [phone]  En caso de inclemencias del [place], por favor llame a Johnsie Kindred principal al [phone] para una actualizacin sobre el [place] de cualquier retraso o cierre.  Consejos para la medicacin en dermatologa: Por favor, guarde las cajas en las que vienen los medicamentos de uso tpico para ayudarle a seguir las instrucciones sobre dnde y cmo usarlos. Las farmacias generalmente imprimen las instrucciones del medicamento slo en las cajas y no directamente en los tubos del [place].   Si su medicamento es muy caro, por favor, pngase en contacto con Zigmund Daniel llamando al [phone] y presione la opcin 4 o envenos un mensaje a travs de Pharmacist, community.   No podemos decirle cul ser  su copago por los medicamentos por adelantado ya que esto es diferente dependiendo de la cobertura de su seguro. Sin embargo, es posible que podamos encontrar un medicamento sustituto a Electrical engineer un formulario para que el seguro cubra el medicamento que se considera necesario.   Si se requiere una autorizacin previa para que su compaa de seguros [place] su medicamento, por favor permtanos de 1 a 2 das hbiles para completar este proceso.  Los precios de los medicamentos varan con frecuencia dependiendo del Environmental consultant de dnde se surte la receta y alguna farmacias pueden ofrecer precios ms baratos.  El sitio web www.goodrx.com tiene cupones para medicamentos de Airline pilot. Los precios aqu no tienen en cuenta lo que podra costar con la ayuda del seguro (puede ser ms barato con su seguro), pero el sitio web puede darle el precio si no utiliz  Research scientist (physical sciences).  - Puede imprimir el cupn correspondiente y llevarlo con su receta a la farmacia.  - Tambin puede pasar por nuestra oficina durante el horario de atencin regular y Charity fundraiser una tarjeta de cupones de GoodRx.  - Si necesita que su receta se enve electrnicamente a una farmacia diferente, informe a nuestra oficina a travs de MyChart de [place] o por telfono llamando al [phone] y presione la opcin 4.

## 2022-02-13 ENCOUNTER — Encounter: Payer: Self-pay | Admitting: Dermatology

## 2023-02-10 ENCOUNTER — Ambulatory Visit: Payer: Medicare Other | Admitting: Dermatology

## 2023-05-19 ENCOUNTER — Ambulatory Visit: Payer: Medicare Other | Admitting: Dermatology

## 2023-06-15 ENCOUNTER — Encounter: Payer: Self-pay | Admitting: Dermatology

## 2023-06-15 ENCOUNTER — Ambulatory Visit (INDEPENDENT_AMBULATORY_CARE_PROVIDER_SITE_OTHER): Admitting: Dermatology

## 2023-06-15 DIAGNOSIS — W908XXA Exposure to other nonionizing radiation, initial encounter: Secondary | ICD-10-CM

## 2023-06-15 DIAGNOSIS — D692 Other nonthrombocytopenic purpura: Secondary | ICD-10-CM | POA: Diagnosis not present

## 2023-06-15 DIAGNOSIS — L603 Nail dystrophy: Secondary | ICD-10-CM

## 2023-06-15 DIAGNOSIS — L570 Actinic keratosis: Secondary | ICD-10-CM | POA: Diagnosis not present

## 2023-06-15 DIAGNOSIS — D1801 Hemangioma of skin and subcutaneous tissue: Secondary | ICD-10-CM

## 2023-06-15 DIAGNOSIS — Z85828 Personal history of other malignant neoplasm of skin: Secondary | ICD-10-CM

## 2023-06-15 DIAGNOSIS — L578 Other skin changes due to chronic exposure to nonionizing radiation: Secondary | ICD-10-CM

## 2023-06-15 DIAGNOSIS — Z1283 Encounter for screening for malignant neoplasm of skin: Secondary | ICD-10-CM

## 2023-06-15 DIAGNOSIS — D229 Melanocytic nevi, unspecified: Secondary | ICD-10-CM

## 2023-06-15 DIAGNOSIS — L821 Other seborrheic keratosis: Secondary | ICD-10-CM

## 2023-06-15 DIAGNOSIS — R202 Paresthesia of skin: Secondary | ICD-10-CM

## 2023-06-15 DIAGNOSIS — L814 Other melanin hyperpigmentation: Secondary | ICD-10-CM

## 2023-06-15 DIAGNOSIS — Z86018 Personal history of other benign neoplasm: Secondary | ICD-10-CM

## 2023-06-15 NOTE — Patient Instructions (Addendum)

## 2023-06-15 NOTE — Progress Notes (Signed)
 Follow-Up Visit   Subjective  Rondal Vandevelde is a 83 y.o. male who presents for the following: Skin Cancer Screening and Full Body Skin Exam Hx of isk, aks, bcc   The patient presents for Total-Body Skin Exam (TBSE) for skin cancer screening and mole check. The patient has spots, moles and lesions to be evaluated, some may be new or changing and the patient may have concern these could be cancer.  The following portions of the chart were reviewed this encounter and updated as appropriate: medications, allergies, medical history  Review of Systems:  No other skin or systemic complaints except as noted in HPI or Assessment and Plan.  Objective  Well appearing patient in no apparent distress; mood and affect are within normal limits.  A full examination was performed including scalp, head, eyes, ears, nose, lips, neck, chest, axillae, abdomen, back, buttocks, bilateral upper extremities, bilateral lower extremities, hands, feet, fingers, toes, fingernails, and toenails. All findings within normal limits unless otherwise noted below.   Relevant physical exam findings are noted in the Assessment and Plan.  scalp x 2, right ear x 2 (4) Erythematous thin papules/macules with gritty scale.   Assessment & Plan   SKIN CANCER SCREENING PERFORMED TODAY.  ACTINIC DAMAGE - Chronic condition, secondary to cumulative UV/sun exposure - diffuse scaly erythematous macules with underlying dyspigmentation - Recommend daily broad spectrum sunscreen SPF 30+ to sun-exposed areas, reapply every 2 hours as needed.  - Staying in the shade or wearing long sleeves, sun glasses (UVA+UVB protection) and wide brim hats (4-inch brim around the entire circumference of the hat) are also recommended for sun protection.  - Call for new or changing lesions.  LENTIGINES, SEBORRHEIC KERATOSES, HEMANGIOMAS - Benign normal skin lesions - Benign-appearing - Call for any changes  MELANOCYTIC NEVI - Tan-brown and/or  pink-flesh-colored symmetric macules and papules - Benign appearing on exam today - Observation - Call clinic for new or changing moles - Recommend daily use of broad spectrum spf 30+ sunscreen to sun-exposed areas.  Purpura - Chronic; persistent and recurrent.  Treatable, but not curable. 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 Chronic rubbing or scratching causes darkening of the skin.  OTC treatments which can help with itch include numbing creams like pramoxine or lidocaine which temporarily reduce itch or Capsaicin-containing creams which cause a burning sensation but which sometimes over time will reset the nerves to stop producing itch.  If you choose to use Capsaicin cream, it is recommended to use it 5 times daily for 1 week followed by 3 times daily for 3-6 weeks. You may have to continue using it long-term.  If not doing well with OTC options, could consider [facility] compounded prescription anti-itch cream with Amitriptyline 5% / Lidocaine 5% / Pramoxine 1% or Amitriptyline 5% / Gabapentin 10% / Lidocaine 5% Cream or other prescription cream or pill options.   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  - Call if any new or changing lesions are noted between office visits  Personal history of other benign neoplasm Right Superior Chest BX proven: pigmented seborrheic overlying melanocytic nevus intradermal type. Clear. Observe for recurrence. Call clinic for new or changing lesions.  Recommend regular skin exams, daily broad-spectrum spf 30+ sunscreen use, and photoprotection.    ACTINIC KERATOSIS (4) scalp x 2, right ear x 2 (4) Actinic keratoses are precancerous spots that appear secondary to cumulative UV radiation exposure/sun exposure over time. They are chronic with expected duration over 1 year. A portion of actinic keratoses will progress to squamous cell carcinoma of the skin. It is not possible to reliably predict which spots will progress to skin cancer and so treatment is recommended to prevent development of skin cancer.  Recommend daily broad spectrum sunscreen SPF 30+ to sun-exposed areas, reapply every 2 hours as needed.  Recommend staying in the shade or wearing long sleeves, sun glasses (UVA+UVB protection) and wide brim hats (4-inch brim around the entire circumference of the hat). Call for new or changing lesions. Destruction of lesion - scalp x 2, right ear x 2 (4) Complexity: simple   Destruction method: cryotherapy   Informed consent: discussed and consent obtained   Timeout:  patient name, date of birth, surgical site, and procedure verified Lesion destroyed using liquid nitrogen: Yes   Region frozen until ice ball extended beyond lesion: Yes   Outcome: patient tolerated procedure well with no complications   Post-procedure details: wound care instructions given   Return in about 1 year (around 06/14/2024) for TBSE.  IRandee Busing, CMA, am acting as scribe for Celine Collard, MD.   Documentation: I have reviewed the above documentation for accuracy and completeness, and I agree with the above.  Celine Collard, MD

## 2024-06-15 ENCOUNTER — Ambulatory Visit: Admitting: Dermatology
